# Patient Record
Sex: Female | Born: 2000 | Race: Black or African American | Hispanic: No | Marital: Single
Health system: Southern US, Community
[De-identification: ages and names within clinical notes are randomized; demographics above are authoritative.]

---
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
## 2023-09-19 ENCOUNTER — Other Ambulatory Visit: Payer: Self-pay

## 2023-09-19 ENCOUNTER — Encounter (HOSPITAL_BASED_OUTPATIENT_CLINIC_OR_DEPARTMENT_OTHER): Payer: Self-pay

## 2023-09-19 ENCOUNTER — Emergency Department (HOSPITAL_BASED_OUTPATIENT_CLINIC_OR_DEPARTMENT_OTHER)
Admission: EM | Admit: 2023-09-19 | Discharge: 2023-09-20 | Disposition: A | Discharge status: Home / Self Care | Payer: Self-pay | Attending: Emergency Medicine | Admitting: Emergency Medicine

## 2023-09-19 DIAGNOSIS — Z7722 Contact with and (suspected) exposure to environmental tobacco smoke (acute) (chronic): Secondary | ICD-10-CM | POA: Insufficient documentation

## 2023-09-19 DIAGNOSIS — L0231 Cutaneous abscess of buttock: Secondary | ICD-10-CM | POA: Insufficient documentation

## 2023-09-19 NOTE — ED Triage Notes (Signed)
Pt has abscess to left upper buttock. Raised area noted. Pt reports this came up approx 2 days ago.

## 2023-09-20 ENCOUNTER — Encounter (HOSPITAL_BASED_OUTPATIENT_CLINIC_OR_DEPARTMENT_OTHER): Payer: Self-pay | Admitting: Emergency Medicine

## 2023-09-20 MED ORDER — LIDOCAINE-EPINEPHRINE (PF) 2 %-1:200000 IJ SOLN
10.0000 mL | Freq: Once | INTRAMUSCULAR | Status: AC
  Administered 2023-09-20: 10 mL
  Filled 2023-09-20: qty 20

## 2023-09-20 MED ORDER — IBUPROFEN 600 MG PO TABS: 600.0000 mg | ORAL_TABLET | Freq: Four times a day (QID) | ORAL | 0 refills | Status: AC | PRN

## 2023-09-20 MED ORDER — ACETAMINOPHEN 325 MG PO TABS: 650.0000 mg | ORAL_TABLET | Freq: Four times a day (QID) | ORAL | 0 refills | Status: AC | PRN

## 2023-09-20 MED ORDER — SULFAMETHOXAZOLE-TRIMETHOPRIM 800-160 MG PO TABS: 1.0000 | ORAL_TABLET | Freq: Two times a day (BID) | ORAL | 0 refills | Status: AC

## 2023-09-20 MED ORDER — LIDOCAINE-EPINEPHRINE-TETRACAINE (LET) TOPICAL GEL
3.0000 mL | Freq: Once | TOPICAL | Status: AC
  Administered 2023-09-20: 3 mL via TOPICAL
  Filled 2023-09-20: qty 3

## 2023-09-20 NOTE — Discharge Instructions (Addendum)
 It was a pleasure caring for you today in the emergency department.  You have had an abscess drained in the Emergency Department. You may shower, let the soapy water clean your wound, do not scrub at it. Keep your wound covered to prevent transmission of infection to other people. Follow up with your primary care physician or in the Emergency Department in a few days for a wound check. Return to the Emergency Department immediately if you develop any of the following symptoms: Fevers, Increased redness or swelling around where your abscess was, Increased pain, or Generalized weakness or vomiting.   Please return to the emergency department immediately for any new or concerning symptoms, or if you get worse.

## 2023-09-20 NOTE — ED Provider Notes (Addendum)
 Casey Green Provider Note  CSN: [id number] Arrival date & time: 09/19/23 2042  Chief Complaint(s) Abscess  HPI Casey Green is a 23 y.o. female with past medical history as below, significant for no significant medical history, no surgical history who presents to the ED with complaint of possible abscess to buttock  Notes discomfort over the past 2 days.  No history of prior abscess per mother and patient.  No drainage.  No recent cuts or wounds.  Has some pain to the affected area.  No fevers or chills.  No change in bowel or bladder function.  Took Tylenol  earlier today without relief of symptoms.  No other complaints verbalized  Past Medical History History reviewed. No pertinent past medical history. There are no active problems to display for this patient.  Home Medication(s) Prior to Admission medications   Not on File                                                                                                                                    Past Surgical History History reviewed. No pertinent surgical history. Family History History reviewed. No pertinent family history.  Social History Social History   Tobacco Use   Smoking status: Passive Smoke Exposure - Never Smoker   Smokeless tobacco: Never   Allergies Patient has no known allergies.  Review of Systems Review of Systems  Constitutional:  Negative for chills and fever.  Respiratory:  Negative for shortness of breath.   Cardiovascular:  Negative for chest pain.  Gastrointestinal:  Negative for abdominal pain.  Genitourinary:  Negative for difficulty urinating.  Skin:        Abscess  All other systems reviewed and are negative.   Physical Exam Vital Signs  I have reviewed the triage vital signs BP (!) 99/54 (BP Location: Left Arm)   Pulse (!) 106   Temp 99.8 F (37.7 C) (Oral)   Resp 20   Ht 5' (1.524 m)   Wt 51.3 kg   LMP 08/01/2023 (Exact  Date)   SpO2 100%   BMI 22.07 kg/m  Physical Exam Vitals and nursing note reviewed. Exam conducted with a chaperone present.  Constitutional:      General: She is not in acute distress.    Appearance: Normal appearance. She is well-developed. She is not ill-appearing.  HENT:     Head: Normocephalic and atraumatic.     Right Ear: External ear normal.     Left Ear: External ear normal.     Nose: Nose normal.     Mouth/Throat:     Mouth: Mucous membranes are moist.  Eyes:     General: No scleral icterus.       Right eye: No discharge.        Left eye: No discharge.  Cardiovascular:     Rate and Rhythm: Normal rate.  Pulmonary:  Effort: Pulmonary effort is normal. No respiratory distress.     Breath sounds: No stridor.  Abdominal:     General: Abdomen is flat. There is no distension.     Tenderness: There is no guarding.  Musculoskeletal:        General: No deformity.     Cervical back: No rigidity.  Skin:    General: Skin is warm and dry.     Coloration: Skin is not cyanotic, jaundiced or pale.          Comments: Small area of induration and fluctuance noted to left buttock.  Does not cross midline, does not appear to cross towards the anus.  No drainage.  Neurological:     Mental Status: She is alert and oriented to person, place, and time.     GCS: GCS eye subscore is 4. GCS verbal subscore is 5. GCS motor subscore is 6.  Psychiatric:        Speech: Speech normal.        Behavior: Behavior normal. Behavior is cooperative.     ED Results and Treatments Labs (all labs ordered are listed, but only abnormal results are displayed) Labs Reviewed - No data to display                                                                                                                        Radiology No results found.  Pertinent labs & imaging results that were available during my care of the patient were reviewed by me and considered in my medical decision making (see MDM  for details).  Medications Ordered in ED Medications  lidocaine -EPINEPHrine -tetracaine  (LET) topical gel (3 mLs Topical Given 09/20/23 0122)  lidocaine -EPINEPHrine  (XYLOCAINE  W/EPI) 2 %-1:200000 (PF) injection 10 mL (10 mLs Other Given 09/20/23 0123)                                                                                                                                     Procedures .Incision and Drainage  Date/Time: 09/20/2023 2:44 AM  Performed by: Elnor Jayson LABOR, DO Authorized by: Elnor Jayson LABOR, DO   Consent:    Consent obtained:  Verbal   Consent given by:  Patient   Risks, benefits, and alternatives were discussed: yes     Risks discussed:  Bleeding, incomplete drainage and pain   Alternatives discussed:  No treatment and delayed treatment Universal protocol:  Procedure explained and questions answered to patient or proxy's satisfaction: yes     Patient identity confirmed:  Verbally with patient and arm band Location:    Type:  Abscess   Size:  3cm   Location: LEFT buttock. Pre-procedure details:    Skin preparation:  Chlorhexidine with alcohol Sedation:    Sedation type:  None Anesthesia:    Anesthesia method:  Topical application and local infiltration   Topical anesthetic:  LET   Local anesthetic:  Lidocaine  2% WITH epi Procedure type:    Complexity:  Complex Procedure details:    Ultrasound guidance: no     Incision types:  Stab incision   Wound management:  Probed and deloculated, extensive cleaning and debrided   Drainage:  Purulent and serosanguinous   Drainage amount:  Copious   Wound treatment:  Wound left open   Packing materials:  None Post-procedure details:    Procedure completion:  Tolerated well, no immediate complications   (including critical care time)  Medical Decision Making / ED Course    Medical Decision Making:    Casey Green is a 23 y.o. female with past medical history as below, significant for no significant medical  history, no surgical history who presents to the ED with complaint of possible abscess to buttock. The complaint involves an extensive differential diagnosis and also carries with it a high risk of complications and morbidity.  Serious etiology was considered. Ddx includes but is not limited to: Abscess, cellulitis, cyst, foreign body, etc.  Complete initial physical exam performed, notably the patient was in no distress, lying prone as it is painful to lie supine.    Reviewed and confirmed nursing documentation for past medical history, family history, social history.  Vital signs reviewed.         Brief summary: 23 year old female without significant medical history here secondary to left buttock pain, possible abscess.  Area of fluctuance and induration to left buttock concerning for possible superficial abscess.  She is amenable to I&D.  She denies concern for pregnancy.      Patient presents with an abscess, located on the LEFT buttock. Performed I&D in ED, please see procedure section for further details. Patient was instructed on importance of Abx and the need for a wound check either with PCP or to return to ED for reevaluation of wound.  Wound care instructions provided to patient and family bedside.  Patient has been instructed to return immediately if the symptoms worsen in any way for re-evaluation. Patient verbalized understanding and is in agreement with current care plan. All questions answered prior to discharge.           Additional history obtained: -Additional history obtained from family -External records from outside source obtained and reviewed including: Chart review including previous notes, labs, imaging, consultation notes including  Prior ED visits, prior labs and imaging   Lab Tests: na  EKG   EKG Interpretation Date/Time:    Ventricular Rate:    PR Interval:    QRS Duration:    QT Interval:    QTC Calculation:   R Axis:      Text  Interpretation:           Imaging Studies ordered: na   Medicines ordered and prescription drug management: Meds ordered this encounter  Medications   lidocaine -EPINEPHrine -tetracaine  (LET) topical gel   lidocaine -EPINEPHrine  (XYLOCAINE  W/EPI) 2 %-1:200000 (PF) injection 10 mL    -I have reviewed the patients home medicines and have made adjustments  as needed   Consultations Obtained: na   Cardiac Monitoring: Continuous pulse oximetry interpreted by myself, 100% on RA.    Social Determinants of Health:  Diagnosis or treatment significantly limited by social determinants of health: no current pcp   Reevaluation: After the interventions noted above, I reevaluated the patient and found that they have improved  Co morbidities that complicate the patient evaluation History reviewed. No pertinent past medical history.    Dispostion: Disposition decision including need for hospitalization was considered, and patient discharged from emergency department.    Final Clinical Impression(s) / ED Diagnoses Final diagnoses:  Abscess of buttock, left        Elnor Jayson LABOR, DO 09/20/23 0246    Elnor Jayson LABOR, DO 09/20/23 [phone]

## 2024-08-17 ENCOUNTER — Other Ambulatory Visit: Payer: Self-pay

## 2024-08-17 ENCOUNTER — Emergency Department (HOSPITAL_BASED_OUTPATIENT_CLINIC_OR_DEPARTMENT_OTHER): Payer: Self-pay

## 2024-08-17 ENCOUNTER — Emergency Department (HOSPITAL_BASED_OUTPATIENT_CLINIC_OR_DEPARTMENT_OTHER)
Admission: EM | Admit: 2024-08-17 | Discharge: 2024-08-17 | Disposition: A | Discharge status: Home / Self Care | Payer: Self-pay | Attending: Emergency Medicine | Admitting: Emergency Medicine

## 2024-08-17 ENCOUNTER — Encounter (HOSPITAL_BASED_OUTPATIENT_CLINIC_OR_DEPARTMENT_OTHER): Payer: Self-pay

## 2024-08-17 DIAGNOSIS — R1011 Right upper quadrant pain: Secondary | ICD-10-CM | POA: Insufficient documentation

## 2024-08-17 DIAGNOSIS — R109 Unspecified abdominal pain: Secondary | ICD-10-CM

## 2024-08-17 DIAGNOSIS — K5900 Constipation, unspecified: Secondary | ICD-10-CM | POA: Insufficient documentation

## 2024-08-17 LAB — CBC
HCT: 34.5 % — ABNORMAL LOW (ref 36.0–46.0)
Hemoglobin: 10.5 g/dL — ABNORMAL LOW (ref 12.0–15.0)
MCH: 24.2 pg — ABNORMAL LOW (ref 26.0–34.0)
MCHC: 30.4 g/dL (ref 30.0–36.0)
MCV: 79.7 fL — ABNORMAL LOW (ref 80.0–100.0)
Platelets: 313 K/uL (ref 150–400)
RBC: 4.33 MIL/uL (ref 3.87–5.11)
RDW: 18 % — ABNORMAL HIGH (ref 11.5–15.5)
WBC: 7.3 K/uL (ref 4.0–10.5)
nRBC: 0 % (ref 0.0–0.2)

## 2024-08-17 LAB — URINALYSIS, ROUTINE W REFLEX MICROSCOPIC
Bilirubin Urine: NEGATIVE
Glucose, UA: NEGATIVE mg/dL
Hgb urine dipstick: NEGATIVE
Ketones, ur: NEGATIVE mg/dL
Nitrite: NEGATIVE
Protein, ur: NEGATIVE mg/dL
Specific Gravity, Urine: 1.015 (ref 1.005–1.030)
pH: 8.5 — ABNORMAL HIGH (ref 5.0–8.0)

## 2024-08-17 LAB — COMPREHENSIVE METABOLIC PANEL WITH GFR
ALT: 7 U/L (ref 0–44)
AST: 13 U/L — ABNORMAL LOW (ref 15–41)
Albumin: 4 g/dL (ref 3.5–5.0)
Alkaline Phosphatase: 67 U/L (ref 38–126)
Anion gap: 10 (ref 5–15)
BUN: 8 mg/dL (ref 6–20)
CO2: 28 mmol/L (ref 22–32)
Calcium: 9.2 mg/dL (ref 8.9–10.3)
Chloride: 104 mmol/L (ref 98–111)
Creatinine, Ser: 0.71 mg/dL (ref 0.44–1.00)
GFR, Estimated: >60 mL/min
Glucose, Bld: 86 mg/dL (ref 70–99)
Potassium: 4.3 mmol/L (ref 3.5–5.1)
Sodium: 141 mmol/L (ref 135–145)
Total Bilirubin: 0.3 mg/dL (ref 0.0–1.2)
Total Protein: 7.1 g/dL (ref 6.5–8.1)

## 2024-08-17 LAB — URINALYSIS, MICROSCOPIC (REFLEX)

## 2024-08-17 LAB — HCG, SERUM, QUALITATIVE: Preg, Serum: NEGATIVE

## 2024-08-17 LAB — LIPASE, BLOOD: Lipase: 19 U/L (ref 11–51)

## 2024-08-17 MED ORDER — OXYCODONE HCL 5 MG PO TABS
5.0000 mg | ORAL_TABLET | Freq: Once | ORAL | Status: AC
  Administered 2024-08-17: 5 mg via ORAL
  Filled 2024-08-17: qty 1

## 2024-08-17 MED ORDER — KETOROLAC TROMETHAMINE 15 MG/ML IJ SOLN
15.0000 mg | Freq: Once | INTRAMUSCULAR | Status: AC
  Administered 2024-08-17: 15 mg via INTRAVENOUS
  Filled 2024-08-17: qty 1

## 2024-08-17 MED ORDER — ACETAMINOPHEN 500 MG PO TABS
1000.0000 mg | ORAL_TABLET | Freq: Once | ORAL | Status: AC
  Administered 2024-08-17: 1000 mg via ORAL
  Filled 2024-08-17: qty 2

## 2024-08-17 MED ORDER — IOHEXOL 300 MG/ML  SOLN
80.0000 mL | Freq: Once | INTRAMUSCULAR | Status: AC | PRN
  Administered 2024-08-17: 80 mL via INTRAVENOUS

## 2024-08-17 NOTE — ED Provider Notes (Signed)
 " [place] [facility] Provider Note   CSN: [id number] Arrival date & time: 08/17/24  0152     History Chief Complaint  Patient presents with   Abdominal Pain    HPI Casey Green is a 24 y.o. female presenting for chief complaint of abdominal pain. States that it started after jumping in bed on the 23rd Severe RUQ abdominal pain. 10/10  Patient's recorded medical, surgical, social, medication list and allergies were reviewed in the Snapshot window as part of the initial history.   Review of Systems   Review of Systems  Constitutional:  Negative for chills and fever.  HENT:  Negative for congestion, ear pain and sore throat.   Eyes:  Negative for pain and visual disturbance.  Respiratory:  Negative for cough, choking and shortness of breath.   Cardiovascular:  Negative for chest pain and palpitations.  Gastrointestinal:  Positive for abdominal pain. Negative for anal bleeding and vomiting.  Genitourinary:  Negative for dysuria and hematuria.  Musculoskeletal:  Negative for arthralgias and back pain.  Skin:  Negative for color change and rash.  Neurological:  Negative for seizures and syncope.  All other systems reviewed and are negative.   Physical Exam Updated Vital Signs BP 120/69   Pulse 84   Temp 98 F (36.7 C)   Resp 20   Wt 54.4 kg   SpO2 100%   BMI 23.44 kg/m  Physical Exam Vitals and nursing note reviewed.  Constitutional:      General: She is not in acute distress.    Appearance: She is well-developed.  HENT:     Head: Normocephalic and atraumatic.  Eyes:     Conjunctiva/sclera: Conjunctivae normal.  Cardiovascular:     Rate and Rhythm: Normal rate and regular rhythm.     Heart sounds: No murmur heard. Pulmonary:     Effort: Pulmonary effort is normal. No respiratory distress.     Breath sounds: Normal breath sounds.  Abdominal:     Palpations: Abdomen is soft.     Tenderness: There is abdominal tenderness.  There is no guarding.  Musculoskeletal:        General: No swelling.     Cervical back: Neck supple.  Skin:    General: Skin is warm and dry.     Capillary Refill: Capillary refill takes less than 2 seconds.  Neurological:     Mental Status: She is alert.  Psychiatric:        Mood and Affect: Mood normal.      ED Course/ Medical Decision Making/ A&P    Procedures Procedures   Medications Ordered in ED Medications  acetaminophen  (TYLENOL ) tablet 1,000 mg (1,000 mg Oral Given 08/17/24 0437)  ketorolac  (TORADOL ) 15 MG/ML injection 15 mg (15 mg Intravenous Given 08/17/24 0437)  oxyCODONE  (Oxy IR/ROXICODONE ) immediate release tablet 5 mg (5 mg Oral Given 08/17/24 0437)  iohexol  (OMNIPAQUE ) 300 MG/ML solution 80 mL (80 mLs Intravenous Contrast Given 08/17/24 0542)   Medical Decision Making:   Casey Green is a 24 y.o. female who presented to the ED today with abdominal pain, detailed above.    Patient placed on continuous vitals and telemetry monitoring while in ED which was reviewed periodically.  Complete initial physical exam performed, notably the patient  was HDS in NAD.     Reviewed and confirmed nursing documentation for past medical history, family history, social history.    Initial Assessment:   With the patient's presentation of abdominal pain, most  likely diagnosis is nonspecific. Other diagnoses were considered including (but not limited to) gastroenteritis, colitis, small bowel obstruction, appendicitis, cholecystitis, pancreatitis, nephrolithiasis, UTI, pyleonephritis. These are considered less likely due to history of present illness and physical exam findings.   This is most consistent with an acute life/limb threatening illness complicated by underlying chronic conditions.   Initial Plan:  CBC/CMP to evaluate for underlying infectious/metabolic etiology for patient's abdominal pain  Lipase to evaluate for pancreatitis  EKG to evaluate for cardiac source of pain   CTAB/Pelvis with contrast to evaluate for structural/surgical etiology of patients' severe abdominal pain.  Urinalysis and repeat physical assessment to evaluate for UTI/Pyelonpehritis  Empiric management of symptoms with escalating pain control and antiemetics as needed.   Initial Study Results:   Laboratory  All laboratory results reviewed without evidence of clinically relevant pathology.    EKG EKG was reviewed independently. Rate, rhythm, axis, intervals all examined and without medically relevant abnormality. ST segments without concerns for elevations.    Radiology All images reviewed independently. Agree with radiology report at this time.   CT ABDOMEN PELVIS W CONTRAST Result Date: 08/17/2024 EXAM: CT ABDOMEN AND PELVIS WITH CONTRAST 08/17/2024 05:41:00 AM TECHNIQUE: CT of the abdomen and pelvis was performed with the administration of 80 mL of iohexol  (OMNIPAQUE ) 300 MG/ML solution. Multiplanar reformatted images are provided for review. Automated exposure control, iterative reconstruction, and/or weight-based adjustment of the mA/kV was utilized to reduce the radiation dose to as low as reasonably achievable. COMPARISON: None available. CLINICAL HISTORY: Abdominal pain, acute, nonlocalized. FINDINGS: LOWER CHEST: No acute abnormality. LIVER: The liver is unremarkable. GALLBLADDER AND BILE DUCTS: Gallbladder is unremarkable. No biliary ductal dilatation. SPLEEN: No acute abnormality. PANCREAS: No acute abnormality. ADRENAL GLANDS: No acute abnormality. KIDNEYS, URETERS AND BLADDER: No stones in the kidneys or ureters. No hydronephrosis. No perinephric or periureteral stranding. Urinary bladder is unremarkable. GI AND BOWEL: Stomach demonstrates no acute abnormality. The appendix is visualized and within normal limits. There is a mild-to-moderate colonic stool burden. No bowel wall thickening, inflammation, or distention. There is no bowel obstruction. PERITONEUM AND RETROPERITONEUM: No free  fluid or fluid collections. No pneumoperitoneum. VASCULATURE: Aorta is normal in caliber. LYMPH NODES: No lymphadenopathy. REPRODUCTIVE ORGANS: The uterus appears normal. No adnexal mass. BONES AND SOFT TISSUES: Visualized osseous structures appear normal. No focal soft tissue abnormality. IMPRESSION: 1. No acute findings in the abdomen or pelvis. 2. Mild-to-moderate colonic stool burden. Electronically signed by: Waddell Calk MD 08/17/2024 05:52 AM EST RP Workstation: [id number]    .   Final Reassessment and Plan:   History of present illness and physical exam findings are most consistent with constipation being the etiology of her are nocturnal abdominal discomfort. Discussed this with patient.  She has a longstanding history of similar.  No other acute pathology today.  Symptoms resolved with symptomatic management.  Recommend starting MiraLAX twice daily x 2 weeks with increasing doses as needed.     Clinical Impression:  1. Abdominal pain, unspecified abdominal location   2. Constipation, unspecified constipation type      Discharge   Final Clinical Impression(s) / ED Diagnoses Final diagnoses:  Abdominal pain, unspecified abdominal location  Constipation, unspecified constipation type    Rx / DC Orders ED Discharge Orders     None         Jerral Meth, MD 08/17/24 [phone]  "

## 2024-08-17 NOTE — ED Triage Notes (Signed)
 Pt arrives with ABD pain that started within the last week. Pt denies n/v. Pt reports pain is worse with movement.
